# Patient Record
Sex: Male | Born: 1955 | Race: White | Hispanic: No | Marital: Married | Smoking: Never smoker
Health system: Southern US, Community
[De-identification: ages and names within clinical notes are randomized; demographics above are authoritative.]

---

## 2007-02-13 ENCOUNTER — Ambulatory Visit: Payer: Self-pay | Admitting: Cardiology

## 2007-02-16 ENCOUNTER — Ambulatory Visit: Payer: Self-pay | Admitting: Cardiology

## 2010-11-05 ENCOUNTER — Ambulatory Visit: Payer: Self-pay | Admitting: Gastroenterology

## 2013-01-13 ENCOUNTER — Ambulatory Visit: Payer: Self-pay | Admitting: Family Medicine

## 2013-10-09 IMAGING — CR DG CHEST 2V
1 series · 2 of 2 positions shown · non-contrast
Comparison: none

REASON FOR EXAM: wheezing
COMMENTS:   LMP: (Male)

[Series 1: pa · 0.17mm/px · 2 of 2 slices shown]
[im 1/2]
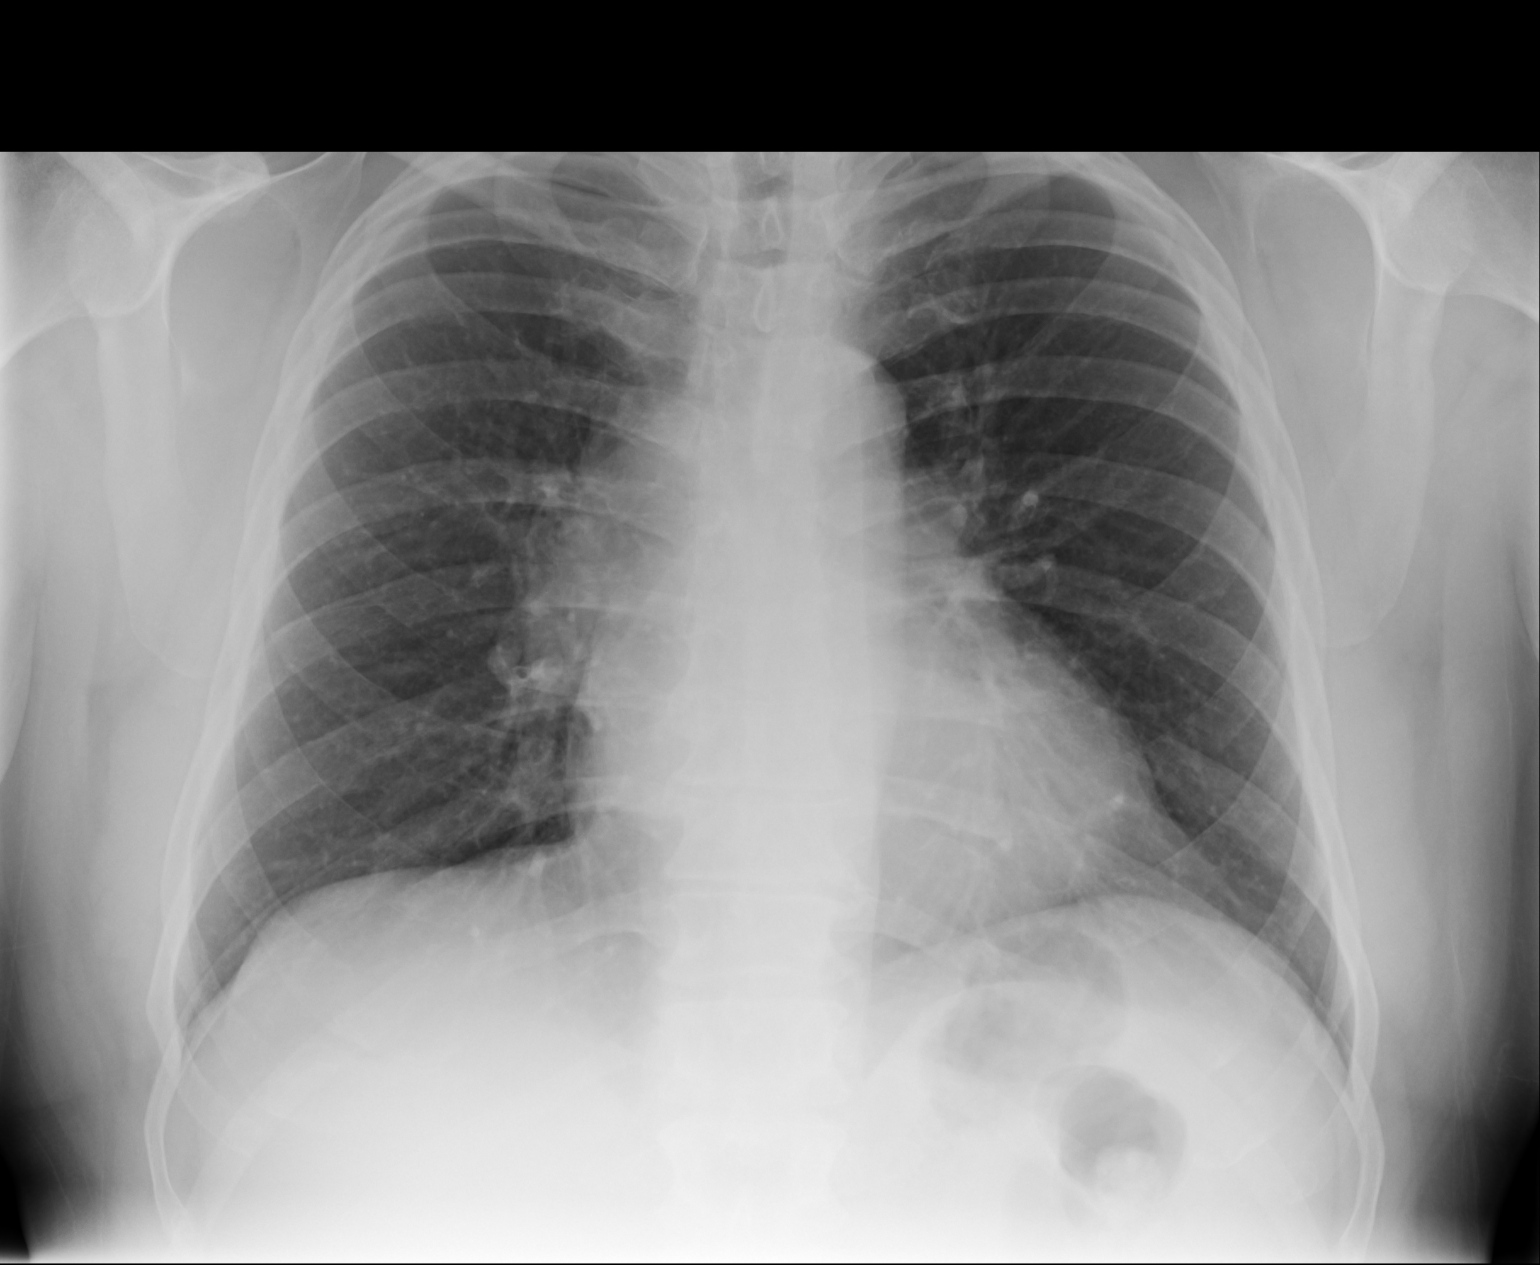
[im 2/2]
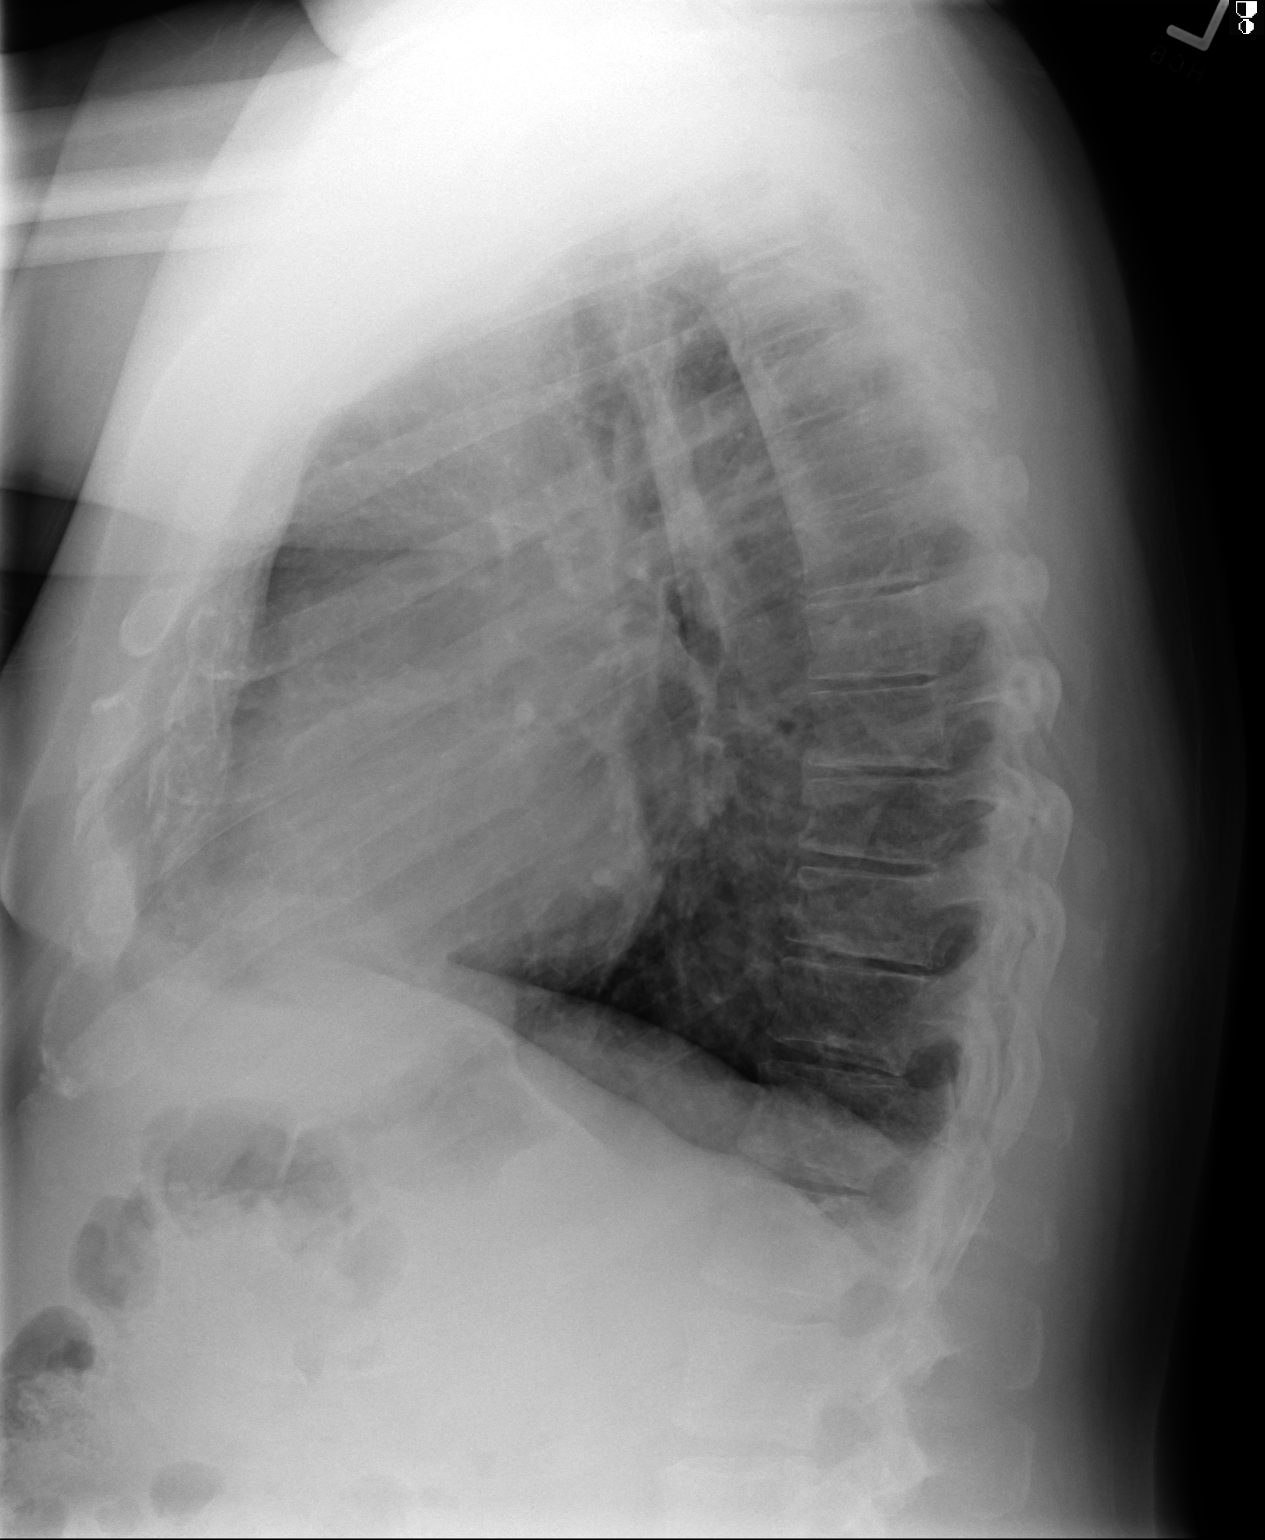

[2 of 2 positions shown; findings below may reference images not displayed]

PROCEDURE:     MDR - MDR CHEST PA(OR AP) AND LATERAL  - January 13, 2013 [DATE]

RESULT:     The lungs are adequately inflated. There is no focal infiltrate.
On the lateral film there is mild hemidiaphragm flattening. The perihilar
lung markings are mildly increased. The cardiac silhouette is normal in size.
IMPRESSION: The findings suggest reactive airway disease. There is no
focal pneumonia.

[REDACTED]

## 2015-07-31 ENCOUNTER — Ambulatory Visit (INDEPENDENT_AMBULATORY_CARE_PROVIDER_SITE_OTHER): Payer: 59 | Admitting: Sports Medicine

## 2015-07-31 ENCOUNTER — Encounter: Payer: Self-pay | Admitting: Sports Medicine

## 2015-07-31 ENCOUNTER — Ambulatory Visit (INDEPENDENT_AMBULATORY_CARE_PROVIDER_SITE_OTHER): Payer: 59

## 2015-07-31 DIAGNOSIS — M79671 Pain in right foot: Secondary | ICD-10-CM

## 2015-07-31 DIAGNOSIS — M779 Enthesopathy, unspecified: Secondary | ICD-10-CM

## 2015-07-31 MED ORDER — TRIAMCINOLONE ACETONIDE 10 MG/ML IJ SUSP: 10.0000 mg | Freq: Once | INTRAMUSCULAR | Status: AC

## 2015-07-31 MED ORDER — DICLOFENAC SODIUM 75 MG PO TBEC: 75.0000 mg | DELAYED_RELEASE_TABLET | Freq: Two times a day (BID) | ORAL | Status: AC

## 2015-07-31 NOTE — Progress Notes (Deleted)
   Subjective:    Patient ID: Brett Contreras, male    DOB: 1956-04-16, 59 y.o.   MRN: [id number]  HPI    Review of Systems  Cardiovascular:       HX of heart attack 2008  All other systems reviewed and are negative.      Objective:   Physical Exam        Assessment & Plan:

## 2015-07-31 NOTE — Progress Notes (Signed)
Patient ID: Brett Contreras, male   DOB: Oct 03, 1955, 59 y.o.   MRN: [id number] Subjective: Brett Contreras is a 59 y.o. male patient who presents to office for evaluation of Right foot pain. Patient complains of progressive pain especially over the last few weeks are moving heavy equiptment at work; reports last episode of pain was on Tuesday at the outside of the ankle. Patient has tried advil and ice with no relief in symptoms. Patient denies any other pedal complaints. Denies known injury/trip/fall/sprain/any causative factors.   There are no active problems to display for this patient.  No current outpatient prescriptions on file prior to visit.   No current facility-administered medications on file prior to visit.   No Known Allergies  Objective:  General: Alert and oriented x3 in no acute distress  Dermatology: No open lesions bilateral lower extremities, no webspace macerations, no ecchymosis bilateral, all nails x 10 are well manicured.  Vascular: Dorsalis Pedis and Posterior Tibial pedal pulses 2/4, Capillary Fill Time 3 seconds,(+) pedal hair growth bilateral, no edema bilateral lower extremities, Temperature gradient within normal limits.  Neurology: Gross sensation intact via light touch bilateral, Protective sensation intact  with Semmes Weinstein Monofilament to all pedal sites, Position sense intact, vibratory intact bilateral, Deep tendon reflexes within normal limits bilateral, No babinski sign present bilateral. (- )Tinels sign right foot.   Musculoskeletal: Mild tenderness with palpation at peroneal tendons and lateral ankle ligaments on right, no ankle instability,No pain with calf compression bilateral. Ankle and pedal joint range of motion is within normal limits.Strength within normal limits in all groups bilateral.   Gait: Antalgic gait with increased medial arch collapse and pronatory influence noted on Right> Left foot with medial 1st MPJ roll off in toe-off.   Xrays   Right Foot 3 views    Impression:normal osseous mineralization. Joint spaces preserved. Supinated foot type. Mild calcaneal spur. Mild hammertoes. No fracture/dislocation. No soft tissue swelling. No foreign body.   Assessment and Plan: Problem List Items Addressed This Visit    None    Visit Diagnoses    Right foot pain    -  Primary    Relevant Medications    diclofenac (VOLTAREN) 75 MG EC tablet    triamcinolone acetonide (KENALOG) 10 MG/ML injection 10 mg    Other Relevant Orders    DG Foot Complete Right    Tendonitis        lateral ankle, peronoeals    Relevant Medications    diclofenac (VOLTAREN) 75 MG EC tablet    triamcinolone acetonide (KENALOG) 10 MG/ML injection 10 mg        -Complete examination performed -Xrays reviewed -Discussed treatement options -After verbal consent injected 2cc mixture of lidocaine, marcaine, dex, and kenalog at point of max tenderness at right lateral ankle and peroneal tendon sheath -Rx Tri-lock ankle brace -Rx Diclofenac -Advised good supportive shoes/boots for work -Patient to return to office in 3 weeks or sooner if condition worsens.  Asencion Islamitorya Clell Trahan, DPM

## 2015-08-25 ENCOUNTER — Ambulatory Visit (INDEPENDENT_AMBULATORY_CARE_PROVIDER_SITE_OTHER): Payer: 59 | Admitting: Sports Medicine

## 2015-08-25 DIAGNOSIS — M79671 Pain in right foot: Secondary | ICD-10-CM

## 2015-08-25 DIAGNOSIS — M779 Enthesopathy, unspecified: Secondary | ICD-10-CM

## 2015-08-25 NOTE — Progress Notes (Signed)
Patient ID: Brett Contreras, male   DOB: 1955-12-02, 59 y.o.   MRN: [id number]  Subjective: Brett Contreras is a 59 y.o. male patient who returns to office for evaluation of Right foot pain. Patient states that after the injection and the bracing his pain is completely resolved and he is feeling 100% better. Patient denies any other pedal complaints.  There are no active problems to display for this patient.  Current Outpatient Prescriptions on File Prior to Visit  Medication Sig Dispense Refill  . aspirin 81 MG tablet Take by mouth.    . CRESTOR 20 MG tablet   1  . diclofenac (VOLTAREN) 75 MG EC tablet Take 1 tablet (75 mg total) by mouth 2 (two) times daily. 30 tablet 0  . metoprolol tartrate (LOPRESSOR) 25 MG tablet   1   Current Facility-Administered Medications on File Prior to Visit  Medication Dose Route Frequency Provider Last Rate Last Dose  . triamcinolone acetonide (KENALOG) 10 MG/ML injection 10 mg  10 mg Other Once [employer] Britlee Skolnik, DPM       No Known Allergies  Objective:  General: Alert and oriented x3 in no acute distress  Dermatology: No open lesions bilateral lower extremities, no webspace macerations, no ecchymosis bilateral, all nails x 10 are well manicured.  Vascular: Dorsalis Pedis and Posterior Tibial pedal pulses 2/4, Capillary Fill Time 3 seconds,(+) pedal hair growth bilateral, no edema bilateral lower extremities, Temperature gradient within normal limits.  Neurology: Gross sensation intact via light touch bilateral, Protective sensation intact  with Semmes Weinstein Monofilament to all pedal sites, Position sense intact, vibratory intact bilateral, Deep tendon reflexes within normal limits bilateral, No babinski sign present bilateral. (- )Tinels sign right foot.   Musculoskeletal: No tenderness with palpation at peroneal tendons and lateral ankle ligaments on right, no ankle instability,No pain with calf compression bilateral. Ankle and pedal joint range of motion is  within normal limits.Strength within normal limits in all groups bilateral.    Assessment and Plan: Problem List Items Addressed This Visit    None    Visit Diagnoses    Right foot pain    -  Primary    resolved    Tendonitis        resolved at lateral ankle and peroneals      -Complete examination performed -Discussed long term plan of care -No reinjection needed at this time; symptoms resolved at right ankle and peroneals  -Advised slow transition/wean from brace over the next month fromTri-lock ankle brace -Advised cont with good supportive shoes/boots for work -[employer] daily as needed. -Patient to return to office as needed or sooner if condition worsens/recurs.  Asencion Islamitorya Harmony Sandell, DPM

## 2015-10-22 ENCOUNTER — Ambulatory Visit (INDEPENDENT_AMBULATORY_CARE_PROVIDER_SITE_OTHER): Payer: 59 | Admitting: Podiatry

## 2015-10-22 ENCOUNTER — Encounter: Payer: Self-pay | Admitting: Podiatry

## 2015-10-22 DIAGNOSIS — M109 Gout, unspecified: Secondary | ICD-10-CM

## 2015-10-22 DIAGNOSIS — M10071 Idiopathic gout, right ankle and foot: Secondary | ICD-10-CM | POA: Diagnosis not present

## 2015-10-22 DIAGNOSIS — M779 Enthesopathy, unspecified: Secondary | ICD-10-CM | POA: Diagnosis not present

## 2015-10-22 MED ORDER — METHYLPREDNISOLONE 4 MG PO TBPK: ORAL_TABLET | ORAL | Status: AC

## 2015-10-22 NOTE — Patient Instructions (Signed)

## 2015-10-23 ENCOUNTER — Telehealth: Payer: Self-pay | Admitting: *Deleted

## 2015-10-23 LAB — CBC WITH DIFFERENTIAL/PLATELET
BASOS: 0 %
Basophils Absolute: 0 10*3/uL (ref 0.0–0.2)
EOS (ABSOLUTE): 0.2 10*3/uL (ref 0.0–0.4)
Eos: 3 %
HEMOGLOBIN: 14.1 g/dL (ref 12.6–17.7)
Hematocrit: 39.9 % (ref 37.5–51.0)
IMMATURE GRANS (ABS): 0 10*3/uL (ref 0.0–0.1)
Immature Granulocytes: 0 %
Lymphocytes Absolute: 2 10*3/uL (ref 0.7–3.1)
Lymphs: 30 %
MCH: 31.3 pg (ref 26.6–33.0)
MCHC: 35.3 g/dL (ref 31.5–35.7)
MCV: 89 fL (ref 79–97)
MONOCYTES: 8 %
Monocytes Absolute: 0.6 10*3/uL (ref 0.1–0.9)
NEUTROS ABS: 3.9 10*3/uL (ref 1.4–7.0)
Neutrophils: 59 %
Platelets: 192 10*3/uL (ref 150–379)
RBC: 4.5 x10E6/uL (ref 4.14–5.80)
RDW: 13.5 % (ref 12.3–15.4)
WBC: 6.7 10*3/uL (ref 3.4–10.8)

## 2015-10-23 LAB — C-REACTIVE PROTEIN: CRP: 11.2 mg/L — ABNORMAL HIGH (ref 0.0–4.9)

## 2015-10-23 LAB — SEDIMENTATION RATE: Sed Rate: 13 mm/hr (ref 0–30)

## 2015-10-23 LAB — URIC ACID: Uric Acid: 6.9 mg/dL (ref 3.7–8.6)

## 2015-10-23 NOTE — Progress Notes (Signed)
Patient ID: Brett Contreras, male   DOB: 1956/01/29, 60 y.o.   MRN: [id number]  Subjective: 60 year old male presents the office today as she called this morning stating his right ankle on NSAID acid is red, swollen and painful which started suddenly when he woke up Tuesday morning. He denies any history of injury or trauma to the area. Denies any changes or numbness. He states the areas very sore to touch. No change in diet. No history of gout. He does state that he has been tested for gout previously as he has had similar flareups. Denies any systemic complaints such as fevers, chills, nausea, vomiting. No acute changes since last appointment, and no other complaints at this time.   Objective: AAO x3, NAD DP/PT pulses palpable bilaterally, CRT less than 3 seconds Protective sensation intact with Simms Weinstein monofilament There is localized erythema, edema and pain along the medial aspect of the ankle. This appears to be localized area there is no ascending synovitis. There is mild pain with ankle range of motion, subtalar joint range of motion intact. There is no areas of fluctuance or crepitus. No areas of pinpoint bony tenderness or pain with vibratory sensation. MMT 5/5, ROM WNL. No edema, erythema, increase in warmth to bilateral lower extremities.  No open lesions or pre-ulcerative lesions.  No pain with calf compression, swelling, warmth, erythema  Assessment: Right ankle erythema most likely a result of gout/capsulitis, I doubt infection  Plan: -All treatment options discussed with the patient including all alternatives, risks, complications.  -Etiology of symptoms were discussed -Will order blood work including ESR, CRP, CBC, uric acid -I discussed steroid injection to the area and he wished to proceed with this. Under sterile conditions a total of 2 mL of a mixture of Kenalog, 0.5% Marcaine plain and 2% lidocaine plain was infiltrated around the area of maximal tenderness on the  medial malleolus and just anterior to that. He tolerated the injection well any competitions. Presentation care was discussed. -Will start Medrol Dosepak -If he does not get relief once this is complete will start colchicine 0.6 mg daily for 10 days. -Follow-up in 2 weeks or sooner if any issues are to arise or any worsening. Call if questions or concerns *x-ray next appointment is symptoms continue.   Celesta Gentile, DPM

## 2015-10-23 NOTE — Telephone Encounter (Addendum)
-----   Message from Vivi Barrack, DPM sent at 10/23/2015  6:37 AM EST ----- Can you please let him know that his blood work was normal except for one of the inflammation markers (CRP). I still think this is gout. Continue steroid. If no improvement in the next week, call the office.  10/23/2015-CALLED PT WITH THE RESULTS and recommendations.  Pt states he just began steroid, but the swelling, has gotten better, but will call if changes.

## 2015-10-23 NOTE — Telephone Encounter (Signed)
Pt's wife, Darrel Reach states pt requested the lab results to be faxed to them at [phone].  Faxed.

## 2015-11-10 ENCOUNTER — Ambulatory Visit: Payer: 59 | Admitting: Podiatry

## 2016-09-03 DIAGNOSIS — J069 Acute upper respiratory infection, unspecified: Secondary | ICD-10-CM | POA: Diagnosis not present

## 2017-03-24 DIAGNOSIS — Z23 Encounter for immunization: Secondary | ICD-10-CM | POA: Diagnosis not present

## 2017-06-07 DIAGNOSIS — E7800 Pure hypercholesterolemia, unspecified: Secondary | ICD-10-CM | POA: Diagnosis not present

## 2017-06-07 DIAGNOSIS — I2510 Atherosclerotic heart disease of native coronary artery without angina pectoris: Secondary | ICD-10-CM | POA: Diagnosis not present

## 2017-06-30 DIAGNOSIS — I2510 Atherosclerotic heart disease of native coronary artery without angina pectoris: Secondary | ICD-10-CM | POA: Diagnosis not present

## 2018-06-13 DIAGNOSIS — E7800 Pure hypercholesterolemia, unspecified: Secondary | ICD-10-CM | POA: Diagnosis not present

## 2018-06-13 DIAGNOSIS — I2510 Atherosclerotic heart disease of native coronary artery without angina pectoris: Secondary | ICD-10-CM | POA: Diagnosis not present
